# Patient Record
Sex: Female | Born: 1985 | Hispanic: Yes | Smoking: Never smoker
Health system: Southern US, Community
[De-identification: ages and names within clinical notes are randomized; demographics above are authoritative.]

## PROBLEM LIST (undated history)

## (undated) DIAGNOSIS — O139 Gestational [pregnancy-induced] hypertension without significant proteinuria, unspecified trimester: Secondary | ICD-10-CM

## (undated) HISTORY — PX: NO PAST SURGERIES: SHX2092

---

## 2006-10-10 ENCOUNTER — Ambulatory Visit (HOSPITAL_COMMUNITY): Admission: RE | Admit: 2006-10-10 | Discharge: 2006-10-10 | Payer: Self-pay | Admitting: Obstetrics and Gynecology

## 2007-03-03 ENCOUNTER — Ambulatory Visit: Payer: Self-pay | Admitting: Family Medicine

## 2007-03-03 ENCOUNTER — Inpatient Hospital Stay (HOSPITAL_COMMUNITY): Admission: AD | Admit: 2007-03-03 | Discharge: 2007-03-03 | Payer: Self-pay | Admitting: Family Medicine

## 2007-03-04 ENCOUNTER — Ambulatory Visit: Payer: Self-pay | Admitting: Obstetrics & Gynecology

## 2007-03-04 ENCOUNTER — Inpatient Hospital Stay (HOSPITAL_COMMUNITY): Admission: AD | Admit: 2007-03-04 | Discharge: 2007-03-06 | Payer: Self-pay | Admitting: Obstetrics and Gynecology

## 2008-01-20 ENCOUNTER — Ambulatory Visit (HOSPITAL_COMMUNITY): Admission: RE | Admit: 2008-01-20 | Discharge: 2008-01-20 | Payer: Self-pay | Admitting: Family Medicine

## 2008-03-18 ENCOUNTER — Ambulatory Visit (HOSPITAL_COMMUNITY): Admission: RE | Admit: 2008-03-18 | Discharge: 2008-03-18 | Payer: Self-pay | Admitting: Obstetrics & Gynecology

## 2008-06-11 ENCOUNTER — Ambulatory Visit: Payer: Self-pay | Admitting: Physician Assistant

## 2008-06-11 ENCOUNTER — Inpatient Hospital Stay (HOSPITAL_COMMUNITY): Admission: AD | Admit: 2008-06-11 | Discharge: 2008-06-13 | Payer: Self-pay | Admitting: Obstetrics & Gynecology

## 2011-04-18 LAB — CBC
HCT: 40.3
Hemoglobin: 14
MCV: 92.3
Platelets: 229
RBC: 4.37
WBC: 17.9 — ABNORMAL HIGH

## 2011-04-18 LAB — RPR: RPR Ser Ql: NONREACTIVE

## 2011-04-28 LAB — URIC ACID: Uric Acid, Serum: 5.9

## 2011-04-28 LAB — CBC
HCT: 34.6 — ABNORMAL LOW
HCT: 41.9
Hemoglobin: 12.3
Hemoglobin: 14.7
MCHC: 35.3
MCHC: 35.6
MCV: 89.5
MCV: 90.1
RBC: 3.86 — ABNORMAL LOW
RBC: 4.54
RDW: 12.5
RDW: 12.6
RDW: 12.9

## 2011-04-28 LAB — URINALYSIS, ROUTINE W REFLEX MICROSCOPIC
Bilirubin Urine: NEGATIVE
Ketones, ur: 15 — AB
Nitrite: NEGATIVE
Specific Gravity, Urine: 1.015
Urobilinogen, UA: 0.2

## 2011-04-28 LAB — URINE MICROSCOPIC-ADD ON

## 2011-04-28 LAB — COMPREHENSIVE METABOLIC PANEL
ALT: 15
Alkaline Phosphatase: 176 — ABNORMAL HIGH
BUN: 6
CO2: 20
Chloride: 105
Glucose, Bld: 110 — ABNORMAL HIGH
Potassium: 3.1 — ABNORMAL LOW
Sodium: 133 — ABNORMAL LOW
Total Bilirubin: 0.9

## 2011-04-28 LAB — LACTATE DEHYDROGENASE: LDH: 205

## 2015-07-18 NOTE — L&D Delivery Note (Signed)
Delivery Note At 4:36 PM a viable and healthy female was delivered via  (Presentation: OA; ROT), nuccal x 1 reduced a perineum easily.  Cord clamping delayed by 60 seconds after delivery.  APGAR: 8,9 ; weight  pending.  Placenta status: intact , spontaneous, manual sweep required due to delay in removing membranes completely .  Cord:  with the following complications: .  Cord pH: not sent  Anesthesia: Epidural  Episiotomy:  None Lacerations:  None Suture Repair: None Est. Blood Loss (mL):  100  Mom to postpartum.  Baby to Couplet care / Skin to Skin.  Olena LeatherwoodKelly M Merrit Friesen 01/11/2016, 4:58 PM

## 2015-07-29 LAB — OB RESULTS CONSOLE ABO/RH: RH Type: POSITIVE

## 2015-07-29 LAB — OB RESULTS CONSOLE RPR: RPR: NONREACTIVE

## 2015-07-29 LAB — OB RESULTS CONSOLE GC/CHLAMYDIA
CHLAMYDIA, DNA PROBE: NEGATIVE
GC PROBE AMP, GENITAL: NEGATIVE

## 2015-07-29 LAB — OB RESULTS CONSOLE RUBELLA ANTIBODY, IGM: RUBELLA: IMMUNE

## 2015-07-29 LAB — OB RESULTS CONSOLE ANTIBODY SCREEN: Antibody Screen: NEGATIVE

## 2015-07-29 LAB — OB RESULTS CONSOLE HIV ANTIBODY (ROUTINE TESTING): HIV: NONREACTIVE

## 2015-12-16 LAB — OB RESULTS CONSOLE GBS
STREP GROUP B AG: POSITIVE
STREP GROUP B AG: POSITIVE

## 2016-01-10 ENCOUNTER — Inpatient Hospital Stay (HOSPITAL_COMMUNITY)
Admission: AD | Admit: 2016-01-10 | Discharge: 2016-01-12 | DRG: 775 | Disposition: A | Discharge status: Home / Self Care | Payer: Medicaid Other | Source: Ambulatory Visit | Attending: Family Medicine | Admitting: Family Medicine

## 2016-01-10 ENCOUNTER — Encounter (HOSPITAL_COMMUNITY): Payer: Self-pay | Admitting: *Deleted

## 2016-01-10 DIAGNOSIS — O1400 Mild to moderate pre-eclampsia, unspecified trimester: Secondary | ICD-10-CM | POA: Diagnosis present

## 2016-01-10 DIAGNOSIS — O134 Gestational [pregnancy-induced] hypertension without significant proteinuria, complicating childbirth: Secondary | ICD-10-CM | POA: Diagnosis present

## 2016-01-10 DIAGNOSIS — O99824 Streptococcus B carrier state complicating childbirth: Secondary | ICD-10-CM | POA: Diagnosis present

## 2016-01-10 DIAGNOSIS — O1494 Unspecified pre-eclampsia, complicating childbirth: Secondary | ICD-10-CM | POA: Diagnosis present

## 2016-01-10 DIAGNOSIS — Z3A40 40 weeks gestation of pregnancy: Secondary | ICD-10-CM

## 2016-01-10 DIAGNOSIS — Z79899 Other long term (current) drug therapy: Secondary | ICD-10-CM | POA: Diagnosis not present

## 2016-01-10 DIAGNOSIS — O133 Gestational [pregnancy-induced] hypertension without significant proteinuria, third trimester: Secondary | ICD-10-CM

## 2016-01-10 HISTORY — DX: Gestational (pregnancy-induced) hypertension without significant proteinuria, unspecified trimester: O13.9

## 2016-01-10 LAB — URINALYSIS, ROUTINE W REFLEX MICROSCOPIC
Bilirubin Urine: NEGATIVE
GLUCOSE, UA: NEGATIVE mg/dL
Hgb urine dipstick: NEGATIVE
KETONES UR: NEGATIVE mg/dL
NITRITE: NEGATIVE
PROTEIN: NEGATIVE mg/dL
Specific Gravity, Urine: <1.005 — ABNORMAL LOW (ref 1.005–1.030)
pH: 6 (ref 5.0–8.0)

## 2016-01-10 LAB — CBC WITH DIFFERENTIAL/PLATELET
Basophils Absolute: 0 10*3/uL (ref 0.0–0.1)
Basophils Relative: 0 %
EOS PCT: 2 %
Eosinophils Absolute: 0.1 10*3/uL (ref 0.0–0.7)
HCT: 37.4 % (ref 36.0–46.0)
HEMOGLOBIN: 13.2 g/dL (ref 12.0–15.0)
LYMPHS ABS: 2.1 10*3/uL (ref 0.7–4.0)
LYMPHS PCT: 29 %
MCH: 30 pg (ref 26.0–34.0)
MCHC: 35.3 g/dL (ref 30.0–36.0)
MCV: 85 fL (ref 78.0–100.0)
MONOS PCT: 4 %
Monocytes Absolute: 0.3 10*3/uL (ref 0.1–1.0)
NEUTROS PCT: 65 %
Neutro Abs: 4.6 10*3/uL (ref 1.7–7.7)
Platelets: 251 10*3/uL (ref 150–400)
RBC: 4.4 MIL/uL (ref 3.87–5.11)
RDW: 13.2 % (ref 11.5–15.5)
WBC: 7.2 10*3/uL (ref 4.0–10.5)

## 2016-01-10 LAB — COMPREHENSIVE METABOLIC PANEL
ALT: 13 U/L — AB (ref 14–54)
AST: 21 U/L (ref 15–41)
Albumin: 2.9 g/dL — ABNORMAL LOW (ref 3.5–5.0)
Alkaline Phosphatase: 185 U/L — ABNORMAL HIGH (ref 38–126)
Anion gap: 11 (ref 5–15)
BUN: 8 mg/dL (ref 6–20)
CHLORIDE: 103 mmol/L (ref 101–111)
CO2: 20 mmol/L — AB (ref 22–32)
CREATININE: 0.51 mg/dL (ref 0.44–1.00)
Calcium: 8.9 mg/dL (ref 8.9–10.3)
GFR calc Af Amer: >60 mL/min (ref >60)
Glucose, Bld: 92 mg/dL (ref 65–99)
POTASSIUM: 3.5 mmol/L (ref 3.5–5.1)
SODIUM: 134 mmol/L — AB (ref 135–145)
Total Bilirubin: 0.4 mg/dL (ref 0.3–1.2)
Total Protein: 7.5 g/dL (ref 6.5–8.1)

## 2016-01-10 LAB — PROTEIN / CREATININE RATIO, URINE
Creatinine, Urine: 38 mg/dL
Protein Creatinine Ratio: 0.37 mg/mg{Cre} — ABNORMAL HIGH (ref 0.00–0.15)
Total Protein, Urine: 14 mg/dL

## 2016-01-10 LAB — TYPE AND SCREEN
ABO/RH(D): O POS
Antibody Screen: NEGATIVE

## 2016-01-10 LAB — URINE MICROSCOPIC-ADD ON: RBC / HPF: NONE SEEN RBC/hpf (ref 0–5)

## 2016-01-10 LAB — ABO/RH: ABO/RH(D): O POS

## 2016-01-10 MED ORDER — PENICILLIN G POTASSIUM 5000000 UNITS IJ SOLR
2.5000 10*6.[IU] | INTRAVENOUS | Status: DC
  Administered 2016-01-10 – 2016-01-11 (×6): 2.5 10*6.[IU] via INTRAVENOUS
  Filled 2016-01-10 (×9): qty 2.5

## 2016-01-10 MED ORDER — SOD CITRATE-CITRIC ACID 500-334 MG/5ML PO SOLN: 30.0000 mL | ORAL | Status: DC | PRN

## 2016-01-10 MED ORDER — OXYTOCIN BOLUS FROM INFUSION: 500.0000 mL | INTRAVENOUS | Status: DC

## 2016-01-10 MED ORDER — FLEET ENEMA 7-19 GM/118ML RE ENEM: 1.0000 | ENEMA | RECTAL | Status: DC | PRN

## 2016-01-10 MED ORDER — PENICILLIN G POTASSIUM 5000000 UNITS IJ SOLR
5.0000 10*6.[IU] | Freq: Once | INTRAVENOUS | Status: AC
  Administered 2016-01-10: 5 10*6.[IU] via INTRAVENOUS
  Filled 2016-01-10: qty 5

## 2016-01-10 MED ORDER — LACTATED RINGERS IV SOLN
INTRAVENOUS | Status: DC
  Administered 2016-01-10 (×2): via INTRAVENOUS

## 2016-01-10 MED ORDER — OXYCODONE-ACETAMINOPHEN 5-325 MG PO TABS: 2.0000 | ORAL_TABLET | ORAL | Status: DC | PRN

## 2016-01-10 MED ORDER — ACETAMINOPHEN 325 MG PO TABS: 650.0000 mg | ORAL_TABLET | ORAL | Status: DC | PRN

## 2016-01-10 MED ORDER — OXYTOCIN 40 UNITS IN LACTATED RINGERS INFUSION - SIMPLE MED
1.0000 m[IU]/min | INTRAVENOUS | Status: DC
  Administered 2016-01-10: 2 m[IU]/min via INTRAVENOUS

## 2016-01-10 MED ORDER — TERBUTALINE SULFATE 1 MG/ML IJ SOLN
0.2500 mg | Freq: Once | INTRAMUSCULAR | Status: DC | PRN
  Filled 2016-01-10: qty 1

## 2016-01-10 MED ORDER — LIDOCAINE HCL (PF) 1 % IJ SOLN
30.0000 mL | INTRAMUSCULAR | Status: DC | PRN
  Filled 2016-01-10 (×2): qty 30

## 2016-01-10 MED ORDER — LACTATED RINGERS IV SOLN: 500.0000 mL | INTRAVENOUS | Status: DC | PRN

## 2016-01-10 MED ORDER — ONDANSETRON HCL 4 MG/2ML IJ SOLN: 4.0000 mg | Freq: Four times a day (QID) | INTRAMUSCULAR | Status: DC | PRN

## 2016-01-10 MED ORDER — OXYTOCIN 40 UNITS IN LACTATED RINGERS INFUSION - SIMPLE MED
2.5000 [IU]/h | INTRAVENOUS | Status: DC
  Administered 2016-01-11: 2.5 [IU]/h via INTRAVENOUS
  Filled 2016-01-10: qty 1000

## 2016-01-10 MED ORDER — FENTANYL CITRATE (PF) 100 MCG/2ML IJ SOLN
100.0000 ug | INTRAMUSCULAR | Status: DC | PRN
  Administered 2016-01-11: 100 ug via INTRAVENOUS
  Filled 2016-01-10: qty 2

## 2016-01-10 MED ORDER — OXYCODONE-ACETAMINOPHEN 5-325 MG PO TABS: 1.0000 | ORAL_TABLET | ORAL | Status: DC | PRN

## 2016-01-10 NOTE — Progress Notes (Signed)
LABOR PROGRESS NOTE  Alyssa GoingDora Patton is a 30 y.o. G3P2002 at 7583w0d  admitted for IOL for SIPE+gHTN  Subjective: Pt remains comfortable, minimal discomfort with contractions  Objective: BP 135/86 mmHg  Pulse 82  Temp(Src) 98.7 F (37.1 C) (Oral)  Resp 16  Ht 5\' 3"  (1.6 m)  Wt 156 lb (70.761 kg)  BMI 27.64 kg/m2 or  Filed Vitals:   01/10/16 1731 01/10/16 1802 01/10/16 1834 01/10/16 1859  BP: 132/85 126/82 124/83 135/86  Pulse: 86 81 87 82  Temp:   98.7 F (37.1 C)   TempSrc:   Oral   Resp: 18  16   Height:      Weight:         Dilation: 2.5 Effacement (%): 70 Station: -2 Presentation: Vertex Exam by:: Alyssa Stalling RN  Labs: Lab Results  Component Value Date   WBC 7.2 01/10/2016   HGB 13.2 01/10/2016   HCT 37.4 01/10/2016   MCV 85.0 01/10/2016   PLT 251 01/10/2016    Patient Active Problem List   Diagnosis Date Noted  . Gestational HTN 01/10/2016    Assessment / Plan: 30 y.o. G3P2002 at 4583w0d here for IOL for SIPE+gHTN  Labor: Latent, need to increase pitocin and if FHT becomes non-reassuring can decrease Fetal Wellbeing:  Cat-1 Pain Control:  Fentanyl, NO Anticipated MOD:  NSVD  Olena LeatherwoodKelly M Lezley Bedgood, MD 01/10/2016, 7:31 PM

## 2016-01-10 NOTE — H&P (Signed)
LABOR AND DELIVERY ADMISSION HISTORY AND PHYSICAL NOTE  Alyssa GoingDora Patton is a 30 y.o. female 783P2002 with IUP at 3235w0d by LMP presenting for IOL for gHTN.  Prenatal risk factors: multipara, GBS positive, gHTN She reports positive fetal movement. She denies leakage of fluid or vaginal bleeding.  Prenatal History/Complications:  Past Medical History: Past Medical History  Diagnosis Date  . Pregnancy induced hypertension     Past Surgical History: Past Surgical History  Procedure Laterality Date  . No past surgeries      Obstetrical History: OB History    Gravida Para Term Preterm AB TAB SAB Ectopic Multiple Living   3 2 2       2       Social History: Social History   Social History  . Marital Status: Married    Spouse Name: N/A  . Number of Children: N/A  . Years of Education: N/A   Social History Main Topics  . Smoking status: Never Smoker   . Smokeless tobacco: None  . Alcohol Use: No  . Drug Use: No  . Sexual Activity: Yes   Other Topics Concern  . None   Social History Narrative  . None    Family History: History reviewed. No pertinent family history.  Allergies: No Known Allergies  Prescriptions prior to admission  Medication Sig Dispense Refill Last Dose  . Prenatal Vit-Fe Fumarate-FA (PRENATAL MULTIVITAMIN) TABS tablet Take 1 tablet by mouth daily at 12 noon.   01/09/2016 at Unknown time     Review of Systems   All systems reviewed and negative except as stated in HPI  Blood pressure 142/92, pulse 85, temperature 97.9 F (36.6 C), temperature source Oral, resp. rate 16. General appearance: alert and cooperative Lungs: clear to auscultation bilaterally Heart: regular rate and rhythm Abdomen: soft, non-tender; bowel sounds normal Extremities: No calf swelling or tenderness Presentation: cephalic Fetal monitoring: Baseline 135, mod variability, +acels, no decels Uterine activity: None     Prenatal labs: ABO, Rh:  O positive Antibody:   Negative Rubella: Immune RPR:   Negative HBsAg:   Negative HIV:   Non-reactive GBS: Positive (06/01 0000)  1 hr Glucola:102  Genetic screening:  Negative Anatomy US: normal  Prenatal Transfer Tool  Maternal Diabetes: No Genetic Screening: Normal Maternal Ultrasounds/Referrals: Normal Fetal Ultrasounds or other Referrals:  None Maternal Substance Abuse:  No Significant Maternal Medications:  None Significant Maternal Lab Results: None  Results for orders placed or performed during the hospital encounter of 01/10/16 (from the past 24 hour(s))  Urinalysis, Routine w reflex microscopic (not at [facility])   Collection Time: 01/10/16 11:22 AM  Result Value Ref Range   Color, Urine YELLOW YELLOW   APPearance CLEAR CLEAR   Specific Gravity, Urine <1.005 (L) 1.005 - 1.030   pH 6.0 5.0 - 8.0   Glucose, UA NEGATIVE NEGATIVE mg/dL   Hgb urine dipstick NEGATIVE NEGATIVE   Bilirubin Urine NEGATIVE NEGATIVE   Ketones, ur NEGATIVE NEGATIVE mg/dL   Protein, ur NEGATIVE NEGATIVE mg/dL   Nitrite NEGATIVE NEGATIVE   Leukocytes, UA SMALL (A) NEGATIVE  Protein / creatinine ratio, urine   Collection Time: 01/10/16 11:22 AM  Result Value Ref Range   Creatinine, Urine 38.00 mg/dL   Total Protein, Urine 14 mg/dL   Protein Creatinine Ratio 0.37 (H) 0.00 - 0.15 mg/mg[Cre]  Urine microscopic-add on   Collection Time: 01/10/16 11:22 AM  Result Value Ref Range   Squamous Epithelial / LPF 0-5 (A) NONE SEEN   WBC, UA  0-5 0 - 5 WBC/hpf   RBC / HPF NONE SEEN 0 - 5 RBC/hpf   Bacteria, UA FEW (A) NONE SEEN  CBC with Differential/Platelet   Collection Time: 01/10/16 12:13 PM  Result Value Ref Range   WBC 7.2 4.0 - 10.5 K/uL   RBC 4.40 3.87 - 5.11 MIL/uL   Hemoglobin 13.2 12.0 - 15.0 g/dL   HCT 16.137.4 09.636.0 - 04.546.0 %   MCV 85.0 78.0 - 100.0 fL   MCH 30.0 26.0 - 34.0 pg   MCHC 35.3 30.0 - 36.0 g/dL   RDW 40.913.2 81.111.5 - 91.415.5 %   Platelets 251 150 - 400 K/uL   Neutrophils Relative % 65 %   Neutro Abs 4.6 1.7 -  7.7 K/uL   Lymphocytes Relative 29 %   Lymphs Abs 2.1 0.7 - 4.0 K/uL   Monocytes Relative 4 %   Monocytes Absolute 0.3 0.1 - 1.0 K/uL   Eosinophils Relative 2 %   Eosinophils Absolute 0.1 0.0 - 0.7 K/uL   Basophils Relative 0 %   Basophils Absolute 0.0 0.0 - 0.1 K/uL    There are no active problems to display for this patient.   Assessment: Alyssa GoingDora Patton is a 30 y.o. G3P2002 at 3164w0d here for IOL for gHTN  #Labor:Latent, will start pitocin #Pain: Epidural now #FWB: Cat-1 #ID:  GBS positive-PCN #MOF: Breast #MOC:IUD #Circ:  Female, not necessary  Olena LeatherwoodKelly M Aguilar 01/10/2016, 12:42 PM   Midwife attestation: I have seen and examined this patient; I agree with above documentation in the resident's note.   Alyssa GoingDora Patton is a 30 y.o. [id number] here for HTN and decreased FM  PE: BP 144/109 mmHg  Pulse 120  Temp(Src) 97.9 F (36.6 C) (Oral)  Resp 16 Gen: calm comfortable, NAD Resp: normal effort, no distress Abd: gravid  ROS, labs, PMH reviewed  Plan: Admit to LD Labor: latent-plan Pitocin FWB: Cat I ID: GBS pos-PCN protocol  Donette LarryMelanie Hammad Finkler, CNM  01/10/2016, 1:29 PM

## 2016-01-10 NOTE — Anesthesia Pain Management Evaluation Note (Signed)
  [facility] Pain Management Visit Note  Patient: Alyssa Patton, 30 y.o., female  "Hello I am a [facility] of the [facility]. We have an anesthesia team available at all times to provide care throughout the hospital, including epidural management and anesthesia for C-section. I don't know your plan for the delivery whether it a natural birth, water birth, IV sedation, nitrous supplementation, doula or epidural, but we want to meet your pain goals."   1.Was your pain managed to your expectations on prior hospitalizations?     2.What is your expectation for pain management during this hospitalization?     3.How can we help you reach that goal?   Record the patient's initial score and the patient's pain goal.   Pain: 4  Pain Goal: 6 [facility] wants you to be able to say your pain was always managed very well.  Alyssa Patton,Alyssa Patton 01/10/2016

## 2016-01-10 NOTE — Progress Notes (Signed)
LABOR PROGRESS NOTE  Alyssa GoingDora Patton is a 30 y.o. [id number] at 2973w0d  admitted for IOL for SIPE+gHTN.  Subjective: Pt lying comfortable, reports contractions on par with menstrual cramps and mild.    Objective: BP 124/85 mmHg  Pulse 85  Temp(Src) 98.4 F (36.9 C) (Oral)  Resp 18  Ht 5\' 3"  (1.6 m)  Wt 156 lb (70.761 kg)  BMI 27.64 kg/m2 or  Filed Vitals:   01/10/16 1501 01/10/16 1534 01/10/16 1601 01/10/16 1633  BP: 135/92 129/86 129/89 124/85  Pulse: 78 84 85 85  Temp:      TempSrc:      Resp: 16  18   Height:      Weight:         Dilation: 2 Effacement (%): 70 Station: -2 Presentation: Vertex Exam by:: foley,rn  Labs: Lab Results  Component Value Date   WBC 7.2 01/10/2016   HGB 13.2 01/10/2016   HCT 37.4 01/10/2016   MCV 85.0 01/10/2016   PLT 251 01/10/2016    Patient Active Problem List   Diagnosis Date Noted  . Gestational HTN 01/10/2016    Assessment / Plan: 30 y.o. G3P2002 at 6073w0d here for IOL for SIPE w/o severe features + gHTN  Labor: Latent, on pitocin Fetal Wellbeing:  Cat-1 Pain Control:  Fentanyl, NO, epidural whenever pt desires Anticipated MOD:  NSVD  Olena LeatherwoodKelly M Sharvil Hoey, MD 01/10/2016, 4:49 PM

## 2016-01-10 NOTE — Progress Notes (Signed)
[facility]  Pt to be admitted to room 174.

## 2016-01-10 NOTE — Progress Notes (Signed)
Labor Progress Note Wallace GoingDora Cruz-Ruiz is a 30 y.o. G3P2002 at 3553w0d presented for IOL for pre-E S: lying in bed. Reports mild contractions. Denies headache, vision changes, RUQ or shortness of breath.  O:  BP 126/80 mmHg  Pulse 83  Temp(Src) 98.7 F (37.1 C) (Oral)  Resp 18  Ht 5\' 3"  (1.6 m)  Wt 156 lb (70.761 kg)  BMI 27.64 kg/m2  GEN: lying in bed comfortably although toco shows contraction every two minutes.  EFM: 150/mod var/+accels/no decels  CVE: Dilation: 2.5 Effacement (%): 70 Station: -2 Presentation: Vertex Exam by:: [place]Brittany Stalling RN   A&P: 30 y.o. [id number] 2553w0d admitted for pre-E.  #Pre-E: BP within normal range for most of this afternoon. No severe features. Will continue to monitor #Labor: on pit. Increased to 9u/hr. Contraction every 2 minutes on toco but patient not feeling.  #Pain: Epidural when in established labor #FWB: CAT-1 #GBS pos: PCN  Almon Herculesaye T Gonfa, MD 8:53 PM

## 2016-01-10 NOTE — MAU Note (Signed)
[facility], had reactive NST & BPP 8/8 there.  Pt denies HA, no uc's, denies bleeding or LOF.

## 2016-01-11 ENCOUNTER — Encounter (HOSPITAL_COMMUNITY): Payer: Self-pay | Admitting: Anesthesiology

## 2016-01-11 ENCOUNTER — Inpatient Hospital Stay (HOSPITAL_COMMUNITY): Payer: Medicaid Other | Admitting: Anesthesiology

## 2016-01-11 DIAGNOSIS — O99824 Streptococcus B carrier state complicating childbirth: Secondary | ICD-10-CM

## 2016-01-11 DIAGNOSIS — Z3A40 40 weeks gestation of pregnancy: Secondary | ICD-10-CM

## 2016-01-11 DIAGNOSIS — O134 Gestational [pregnancy-induced] hypertension without significant proteinuria, complicating childbirth: Secondary | ICD-10-CM

## 2016-01-11 LAB — CBC
HCT: 35.8 % — ABNORMAL LOW (ref 36.0–46.0)
Hemoglobin: 12.2 g/dL (ref 12.0–15.0)
MCH: 29 pg (ref 26.0–34.0)
MCHC: 34.1 g/dL (ref 30.0–36.0)
MCV: 85 fL (ref 78.0–100.0)
Platelets: 219 10*3/uL (ref 150–400)
RBC: 4.21 MIL/uL (ref 3.87–5.11)
RDW: 13.4 % (ref 11.5–15.5)
WBC: 8 10*3/uL (ref 4.0–10.5)

## 2016-01-11 LAB — RPR: RPR: NONREACTIVE

## 2016-01-11 MED ORDER — FENTANYL 2.5 MCG/ML BUPIVACAINE 1/10 % EPIDURAL INFUSION (WH - ANES)
14.0000 mL/h | INTRAMUSCULAR | Status: DC | PRN
  Administered 2016-01-11 (×2): 14 mL/h via EPIDURAL
  Filled 2016-01-11: qty 125

## 2016-01-11 MED ORDER — ONDANSETRON HCL 4 MG/2ML IJ SOLN: 4.0000 mg | INTRAMUSCULAR | Status: DC | PRN

## 2016-01-11 MED ORDER — DIBUCAINE 1 % RE OINT: 1.0000 "application " | TOPICAL_OINTMENT | RECTAL | Status: DC | PRN

## 2016-01-11 MED ORDER — PRENATAL MULTIVITAMIN CH
1.0000 | ORAL_TABLET | Freq: Every day | ORAL | Status: DC
  Administered 2016-01-12: 1 via ORAL
  Filled 2016-01-11: qty 1

## 2016-01-11 MED ORDER — LACTATED RINGERS IV SOLN
500.0000 mL | Freq: Once | INTRAVENOUS | Status: AC
  Administered 2016-01-11: 500 mL via INTRAVENOUS

## 2016-01-11 MED ORDER — LIDOCAINE HCL (PF) 1 % IJ SOLN
INTRAMUSCULAR | Status: DC | PRN
  Administered 2016-01-11: 6 mL via EPIDURAL
  Administered 2016-01-11: 5 mL via EPIDURAL

## 2016-01-11 MED ORDER — TETANUS-DIPHTH-ACELL PERTUSSIS 5-2.5-18.5 LF-MCG/0.5 IM SUSP: 0.5000 mL | Freq: Once | INTRAMUSCULAR | Status: DC

## 2016-01-11 MED ORDER — SENNOSIDES-DOCUSATE SODIUM 8.6-50 MG PO TABS
2.0000 | ORAL_TABLET | ORAL | Status: DC
  Administered 2016-01-11: 2 via ORAL
  Filled 2016-01-11: qty 2

## 2016-01-11 MED ORDER — ONDANSETRON HCL 4 MG PO TABS
4.0000 mg | ORAL_TABLET | ORAL | Status: DC | PRN
  Administered 2016-01-11: 4 mg via ORAL
  Filled 2016-01-11: qty 1

## 2016-01-11 MED ORDER — PHENYLEPHRINE 40 MCG/ML (10ML) SYRINGE FOR IV PUSH (FOR BLOOD PRESSURE SUPPORT)
80.0000 ug | PREFILLED_SYRINGE | INTRAVENOUS | Status: DC | PRN
  Filled 2016-01-11: qty 5
  Filled 2016-01-11: qty 10

## 2016-01-11 MED ORDER — ACETAMINOPHEN 325 MG PO TABS
650.0000 mg | ORAL_TABLET | ORAL | Status: DC | PRN
  Administered 2016-01-12: 650 mg via ORAL
  Filled 2016-01-11: qty 2

## 2016-01-11 MED ORDER — DIPHENHYDRAMINE HCL 25 MG PO CAPS: 25.0000 mg | ORAL_CAPSULE | Freq: Four times a day (QID) | ORAL | Status: DC | PRN

## 2016-01-11 MED ORDER — SIMETHICONE 80 MG PO CHEW: 80.0000 mg | CHEWABLE_TABLET | ORAL | Status: DC | PRN

## 2016-01-11 MED ORDER — ZOLPIDEM TARTRATE 5 MG PO TABS: 5.0000 mg | ORAL_TABLET | Freq: Every evening | ORAL | Status: DC | PRN

## 2016-01-11 MED ORDER — IBUPROFEN 600 MG PO TABS
600.0000 mg | ORAL_TABLET | Freq: Four times a day (QID) | ORAL | Status: DC
  Administered 2016-01-11 – 2016-01-12 (×4): 600 mg via ORAL
  Filled 2016-01-11 (×4): qty 1

## 2016-01-11 MED ORDER — EPHEDRINE 5 MG/ML INJ
10.0000 mg | INTRAVENOUS | Status: DC | PRN
  Filled 2016-01-11: qty 2

## 2016-01-11 MED ORDER — WITCH HAZEL-GLYCERIN EX PADS: 1.0000 "application " | MEDICATED_PAD | CUTANEOUS | Status: DC | PRN

## 2016-01-11 MED ORDER — BENZOCAINE-MENTHOL 20-0.5 % EX AERO: 1.0000 "application " | INHALATION_SPRAY | CUTANEOUS | Status: DC | PRN

## 2016-01-11 MED ORDER — PHENYLEPHRINE 40 MCG/ML (10ML) SYRINGE FOR IV PUSH (FOR BLOOD PRESSURE SUPPORT)
80.0000 ug | PREFILLED_SYRINGE | INTRAVENOUS | Status: DC | PRN
  Filled 2016-01-11: qty 5

## 2016-01-11 MED ORDER — COCONUT OIL OIL: 1.0000 "application " | TOPICAL_OIL | Status: DC | PRN

## 2016-01-11 MED ORDER — DIPHENHYDRAMINE HCL 50 MG/ML IJ SOLN: 12.5000 mg | INTRAMUSCULAR | Status: DC | PRN

## 2016-01-11 NOTE — Anesthesia Procedure Notes (Signed)
Epidural Patient location during procedure: OB Start time: 01/11/2016 9:42 AM End time: 01/11/2016 9:45 AM  Staffing Anesthesiologist: Leilani AbleHATCHETT, Chanita Boden Performed by: anesthesiologist   Preanesthetic Checklist Completed: patient identified, surgical consent, pre-op evaluation, timeout performed, IV checked, risks and benefits discussed and monitors and equipment checked  Epidural Patient position: sitting Prep: site prepped and draped and DuraPrep Patient monitoring: continuous pulse ox and blood pressure Approach: midline Location: L3-L4 Injection technique: LOR air  Needle:  Needle type: Tuohy  Needle gauge: 17 G Needle length: 9 cm and 9 Needle insertion depth: 4 cm Catheter type: closed end flexible Catheter size: 19 Gauge Catheter at skin depth: 9 cm Test dose: negative and Other  Assessment Sensory level: T9 Events: blood not aspirated, injection not painful, no injection resistance, negative IV test and no paresthesia  Additional Notes Reason for block:procedure for pain

## 2016-01-11 NOTE — Lactation Note (Signed)
This note was copied from a baby's chart. Lactation Consultation Note  Patient Name: Alyssa Patton [id number]'XToday's Date: 01/11/2016 Reason for consult: Initial assessment Interpreter used. Baby at 4 hr of life. Experienced bf mom reports latching is going well, denies breast or nipple pain, voiced no concerns. She wants to bf and formula feed. Mom was really sleepy, may need more education. Discussed baby behavior, feeding frequency, supplementing, baby belly size, voids, wt loss, breast changes, and nipple care. She stated she can manually express and has spoon in room. Given lactation handouts. Aware of OP services and support group. She will call as needed.     Maternal Data Has patient been taught Hand Expression?: Yes Does the patient have breastfeeding experience prior to this delivery?: Yes  Feeding Feeding Type: Breast Fed Length of feed: 15 min  LATCH Score/Interventions Latch: Grasps breast easily, tongue down, lips flanged, rhythmical sucking.  Audible Swallowing: None Intervention(s): Skin to skin  Type of Nipple: Everted at rest and after stimulation  Comfort (Breast/Nipple): Soft / non-tender     Hold (Positioning): No assistance needed to correctly position infant at breast.  LATCH Score: 8  Lactation Tools Discussed/Used WIC Program: Yes   Consult Status Consult Status: Follow-up Date: 01/12/16 Follow-up type: In-patient    Alyssa Patton 01/11/2016, 8:51 PM

## 2016-01-11 NOTE — Progress Notes (Signed)
I assisted RN with admission questions, also I assisted [facility]Elizabeth [facility], with some questions, by Orlan LeavensViria Alvarez Spanish Interpreter.

## 2016-01-11 NOTE — Progress Notes (Signed)
I was present during the delivery with Dr Cephus RicherAguilar, by Orlan LeavensViria Alvarez Spanish Interpreter.

## 2016-01-11 NOTE — Progress Notes (Signed)
Labor Progress Note Wallace GoingDora Cruz-Ruiz is a 30 y.o. G3P2002 at 5761w0d presented for IOL for pre-E S: lying in bed. Reports contractions. Denies headache, vision changes, RUQ or shortness of breath.  O:  BP 122/67 mmHg  Pulse 93  Temp(Src) 98.4 F (36.9 C) (Oral)  Resp 16  Ht 5\' 3"  (1.6 m)  Wt 156 lb (70.761 kg)  BMI 27.64 kg/m2  GEN: lying in bed comfortably although toco shows contraction every two minutes.  EFM: 140/mod var/+accels/no decels  CVE: Dilation: 2 Effacement (%): 70 Station: -2 Presentation: Vertex Exam by:: Dr. Alanda SlimGonfa   A&P: 30 y.o. [id number] 7861w0d admitted for pre-E.  #Pre-E: one mild range BP this morning. No severe features. Will continue to monitor #Labor: continue pitocin #Pain: as needed pain for now. Epidural when in established labor #FWB: CAT-1 #GBS pos: PCN  Almon Herculesaye T Denicia Pagliarulo, MD 6:12 AM

## 2016-01-11 NOTE — Progress Notes (Signed)
LABOR PROGRESS NOTE  Alyssa GoingDora Patton is a 30 y.o. G3P2002 at 7332w1d  admitted for IOL for pre-eclampsia without severe features  Subjective: Pt remains comfortable, epidural  Objective: BP 122/82 mmHg  Pulse 78  Temp(Src) 98.6 F (37 C) (Oral)  Resp 18  Ht 5\' 3"  (1.6 m)  Wt 156 lb (70.761 kg)  BMI 27.64 kg/m2  SpO2 98% or  Filed Vitals:   01/11/16 1006 01/11/16 1010 01/11/16 1011 01/11/16 1015  BP: 125/78  123/81 122/82  Pulse: 71  72 78  Temp:      TempSrc:      Resp: 20  20 18   Height:      Weight:      SpO2:  98%      Dilation: 5 Effacement (%): 80 Station: 0 Presentation: Vertex Exam by:: Gifford ShaveYancey Luft RN   Labs: Lab Results  Component Value Date   WBC 8.0 01/11/2016   HGB 12.2 01/11/2016   HCT 35.8* 01/11/2016   MCV 85.0 01/11/2016   PLT 219 01/11/2016    Patient Active Problem List   Diagnosis Date Noted  . Gestational HTN 01/10/2016    Assessment / Plan: 30 y.o. G3P2002 at 7132w1d here for IOL for pre-eclampsia without severe features  Labor: Latent, Fetal Wellbeing:  Cat-II (variable, late decel x1 noted), pt was repositioned and bolus given by nursing Pain Control:  Epidural Anticipated MOD:  NSVD  Olena LeatherwoodKelly M Deadra Diggins, MD 01/11/2016, 10:23 AM

## 2016-01-11 NOTE — Anesthesia Preprocedure Evaluation (Signed)
Anesthesia Evaluation  Patient identified by MRN, date of birth, ID band Patient awake    Reviewed: Allergy & Precautions, H&P , Patient's Chart, lab work & pertinent test results  Airway Mallampati: I  TM Distance: >3 FB Neck ROM: full    Dental no notable dental hx.    Pulmonary neg pulmonary ROS,    Pulmonary exam normal        Cardiovascular hypertension, Normal cardiovascular exam     Neuro/Psych negative neurological ROS  negative psych ROS   GI/Hepatic negative GI ROS, Neg liver ROS,   Endo/Other  negative endocrine ROS  Renal/GU negative Renal ROS     Musculoskeletal   Abdominal Normal abdominal exam  (+)   Peds  Hematology negative hematology ROS (+)   Anesthesia Other Findings   Reproductive/Obstetrics (+) Pregnancy                             Anesthesia Physical Anesthesia Plan  ASA: II  Anesthesia Plan: Epidural   Post-op Pain Management:    Induction:   Airway Management Planned:   Additional Equipment:   Intra-op Plan:   Post-operative Plan:   Informed Consent: I have reviewed the patients History and Physical, chart, labs and discussed the procedure including the risks, benefits and alternatives for the proposed anesthesia with the patient or authorized representative who has indicated his/her understanding and acceptance.     Plan Discussed with:   Anesthesia Plan Comments:         Anesthesia Quick Evaluation

## 2016-01-12 NOTE — Progress Notes (Signed)
I assisted Jessica, RN with questions. Eda H Royal Interpreter. °

## 2016-01-12 NOTE — Anesthesia Postprocedure Evaluation (Signed)
Anesthesia Post Note  Patient: Alyssa Patton  Procedure(s) Performed: * No procedures listed *  Patient location during evaluation: Mother Baby Anesthesia Type: Epidural Level of consciousness: awake Pain management: pain level controlled Vital Signs Assessment: post-procedure vital signs reviewed and stable Respiratory status: spontaneous breathing Cardiovascular status: stable Postop Assessment: no headache, no backache, epidural receding and patient able to bend at knees (ambulating;interpreter used for questioning) Anesthetic complications: no     Last Vitals:  Filed Vitals:   01/11/16 2330 01/12/16 0542  BP: 113/75 111/66  Pulse: 79 83  Temp: 37 C 36.7 C  Resp: 18 18    Last Pain:  Filed Vitals:   01/12/16 0824  PainSc: 0-No pain   Pain Goal: Patients Stated Pain Goal: 3 (01/12/16 0601)               Edison PaceWILKERSON,Govind Furey

## 2016-01-12 NOTE — Discharge Summary (Signed)
OB Discharge Summary     Patient Name: Alyssa Patton DOB: 09/05/1985 MRN: [id number]  Date of admission: 01/10/2016 Delivering MD: Shonna ChockWOUK, NOAH BEDFORD   Date of discharge: 01/14/2016  Admitting diagnosis: 40WKS, HIGH BP Intrauterine pregnancy: 3324w1d     Secondary diagnosis:  Principal Problem:   NSVD (normal spontaneous vaginal delivery) Active Problems:   Mild preeclampsia  Additional problems: Normal vaginal delivery     Discharge diagnosis: Term Pregnancy Delivered                                                                                                Post partum procedures:None  Augmentation: Pitocin and Foley Balloon  Complications: None  Hospital course:  Induction of Labor With Vaginal Delivery   30 y.o. yo G3P3001 at 5424w1d was admitted to the hospital 01/10/2016 for induction of labor.  Indication for induction: Preeclampsia.  Patient had an uncomplicated labor course as follows: Membrane Rupture Time/Date: 3:08 PM ,01/11/2016   Intrapartum Procedures: Episiotomy: None [1]                                         Lacerations:  None [1]  Patient had delivery of a Viable infant.  Information for the patient's newborn:  Cindie LarocheCruz Hernandez, Kaylee Alexa [[id number]][[id number]]  Delivery Method: Vag-Spont    01/11/2016  Details of delivery can be found in separate delivery note.  Patient had a routine postpartum course. Patient is discharged home 01/14/2016.   Physical exam  Filed Vitals:   01/11/16 1929 01/11/16 2330 01/12/16 0542 01/12/16 1815  BP: 132/78 113/75 111/66 137/85  Pulse: 71 79 83 67  Temp: 97.8 F (36.6 C) 98.6 F (37 C) 98.1 F (36.7 C) 98.8 F (37.1 C)  TempSrc: Oral Oral Oral Oral  Resp: 18 18 18 18   Height:      Weight:      SpO2:   98%    General: alert, cooperative and no distress Lochia: appropriate Uterine Fundus: firm Incision: N/A DVT Evaluation: No evidence of DVT seen on physical exam. Labs: Lab Results  Component Value Date   WBC 8.0  01/11/2016   HGB 12.2 01/11/2016   HCT 35.8* 01/11/2016   MCV 85.0 01/11/2016   PLT 219 01/11/2016   CMP Latest Ref Rng 01/10/2016  Glucose 65 - 99 mg/dL 92  BUN 6 - 20 mg/dL 8  Creatinine 8.410.44 - 3.241.00 mg/dL 4.010.51  Sodium 027135 - 253145 mmol/L 134(L)  Potassium 3.5 - 5.1 mmol/L 3.5  Chloride 101 - 111 mmol/L 103  CO2 22 - 32 mmol/L 20(L)  Calcium 8.9 - 10.3 mg/dL 8.9  Total Protein 6.5 - 8.1 g/dL 7.5  Total Bilirubin 0.3 - 1.2 mg/dL 0.4  Alkaline Phos 38 - 126 U/L 185(H)  AST 15 - 41 U/L 21  ALT 14 - 54 U/L 13(L)    Discharge instruction: per After Visit Summary and "Baby and Me Booklet".  After visit meds:    Medication List    TAKE these  medications        prenatal multivitamin Tabs tablet  Take 1 tablet by mouth daily at 12 noon.        Diet: routine diet  Activity: Advance as tolerated. Pelvic rest for 6 weeks.   Outpatient follow up:6 weeks- postpartum. Baby love for BP check Postpartum contraception: IUD Mirena  Newborn Data: Live born female  Birth Weight: 7 lb 15.2 oz (3605 g) APGAR: 8, 9  Baby Feeding: Breast Disposition:home with mother   01/12/2016 Olena LeatherwoodKelly M Aguilar, MD   OB fellow attestation I have seen and examined this patient and agree with above documentation in the resident's note.   Alyssa Patton is a 30 y.o. G3P3001 s/p NSVD.   Pain is well controlled.  Plan for birth control is IUD.  Method of Feeding: breast  PE:  BP 137/85 mmHg  Pulse 67  Temp(Src) 98.8 F (37.1 C) (Oral)  Resp 18  Ht 5\' 3"  (1.6 m)  Wt 156 lb (70.761 kg)  BMI 27.64 kg/m2  SpO2 98%  Breastfeeding? Unknown Gen: well appearing Heart: reg rate Lungs: normal WOB Fundus firm Ext: soft, no pain, no edema  No results for input(s): HGB, HCT in the last 72 hours.  Plan: discharge today - postpartum care discussed - f/u clinic in 6 weeks for postpartum visit   Federico FlakeKimberly Niles Magaby Rumberger, MD 11:21 AM

## 2016-01-12 NOTE — Discharge Instructions (Signed)
Hipertensin posparto (Postpartum Hypertension) La hipertensin posparto es cuando la presin arterial se mantiene ms alta que lo normal durante ms de [employer] del [place]. Puede no darse cuenta de que tiene hipertensin posparto si no le miden la presin arterial con regularidad. En algunos casos, la hipertensin posparto desaparece sola, por lo general en la semana posterior al parto. Sin embargo, algunas mujeres requieren tratamiento mdico para prevenir complicaciones graves, como convulsiones o un ictus. Entre las cosas que pueden influir en la presin arterial, se incluye lo siguiente:  El tipo de [place].  La administracin de lquidos u otros medicamentos por va intravenosa durante o despus del parto. CAUSAS  La hipertensin posparto puede ser consecuencia de cualquiera de los siguientes factores o de la combinacin de cualquiera de ellos:  Hipertensin que exista antes del embarazo (hipertensin crnica).  Hipertensin gestacional.  Preeclampsia o eclampsia.  Administracin de mucho lquido a travs de una va intravenosa durante o despus del parto.  Medicamentos.  Sndrome de HELLP  Hipertiroidismo.  Ictus.  Otros trastornos neurolgicos o sanguneos poco frecuentes. En algunos casos, es posible que la causa no se conozca. FACTORES DE RIESGO La hipertensin posparto puede relacionarse con uno o ms factores de riesgo, por ejemplo:  Hipertensin crnica. En algunos casos, es posible que esta no se haya diagnosticado antes del embarazo.  Obesidad.  Diabetes tipo 2.  Enfermedad renal.  Antecedentes familiares de preeclampsia.  Otras enfermedades que causan desequilibrios hormonales. SIGNOS Y SNTOMAS Al igual que con todos los tipos de hipertensin, la hipertensin posparto puede no causar ningn sntoma. Segn lo alta que est la presin arterial, puede presentar lo siguiente:  Dolores de [place]. Estos pueden ser leves, moderados o intensos. Tambin  pueden ser regulares, constantes o de inicio repentino (cefalea en estallido).  Cambios en la visin.  Mareos.  Falta de aire.  Hinchazn de [employer], los pies, la parte inferior de las piernas o la cara. En algunos casos, puede tener hinchazn en varias de estas reas.  Palpitaciones o latidos cardacos acelerados.  Dificultad para respirar al Tressie [place].  Disminucin de la cantidad [place]. Otros signos y sntomas poco frecuentes pueden incluir lo siguiente:  Ms sudoracin que la habitual. Esta dura ms que [employer] del parto.  Dolor en el pecho.  Mareos repentinos al levantarse despus de haber estado sentada o [place].  Convulsiones.  Nuseas o vmitos.  Dolor abdominal. DIAGNSTICO El diagnstico de hipertensin posparto se establece mediante la combinacin de los [place] de los exmenes fsicos y los anlisis de sangre y [place]. Tambin pueden realizarle [employer], como una tomografa computarizada o una resonancia magntica, para Engineer, manufacturing otras complicaciones de la hipertensin posparto. TRATAMIENTO Cuando la presin arterial est lo suficientemente alta como para requerir tratamiento, las opciones incluyen lo siguiente:  Medicamentos para disminuir la presin arterial (antihipertensivos). Dgale al mdico si est amamantando o si planifica hacerlo. Hay muchos medicamentos antihipertensivos que pueden tomarse sin riesgos durante la Market researcher.  Interrupcin de los [employer] puedan causar la hipertensin.  Tratamiento de las enfermedades que causan la hipertensin.  Tratamiento de las complicaciones de la hipertensin, como convulsiones, ictus o problemas renales. El mdico seguir supervisando atentamente y de forma repetida su presin arterial hasta que esta se encuentre en un nivel seguro para usted.  INSTRUCCIONES PARA EL CUIDADO EN EL HOGAR  Tome los medicamentos solamente como se lo haya indicado el mdico.  Haga actividad  fsica con regularidad despus de que el mdico le diga  que es seguro hacerlo.  Siga las recomendaciones de su mdico sobre los lmites en el consumo de sal y lquido.  No consuma ningn producto que contenga tabaco, lo que incluye cigarrillos, tabaco de Theatre managermascar o Administrator, Civil Servicecigarrillos elecChemical engineertrnicos. Si necesita ayuda para dejar de fumar, consulte al mdico.  Concurra a todas las visitas de control como se lo haya indicado el mdico. Esto es importante. SOLICITE ATENCIN MDICA SI:  Los sntomas empeoran.  Aparecen nuevos sntomas, por ejemplo:  Dolor de cabeza.  Mareos.  Cambios en la visin. SOLICITE ATENCIN MDICA DE INMEDIATO SI:  Tiene un dolor de cabeza intenso o repentino.  Tiene convulsiones.  Siente debilidad o adormecimiento en un lado del cuerpo.  Tiene dificultades para pensar, hablar o tragar.  Sufre un dolor abdominal intenso.  Tiene dificultad para respirar, dolor en el pecho, latidos cardacos acelerados o palpitaciones. Estos sntomas pueden representar un problema grave que constituye Radio broadcast assistantuna emergencia. No espere hasta que los sntomas desaparezcan. Solicite atencin mdica de inmediato. Comunquese con el servicio de emergencias de su localidad (911 en los [place]). No conduzca por sus propios medios OfficeMax Incorporatedhasta el hospital.   Esta informacin no tiene Theme park managercomo fin reemplazar el consejo del mdico. Asegrese de hacerle al mdico cualquier pregunta que tenga.   Document Released: 04/17/2014 Elsevier Interactive Patient Education Yahoo! Inc2016 Elsevier Inc.  Preeclampsia y eclampsia (Preeclampsia and Eclampsia) La preeclampsia es una afeccin grave que solo se manifiesta durante el Deshlerembarazo. Tambin se la conoce como toxemia del Psychiatristembarazo. Esta afeccin provoca el aumento de la presin arterial junto con otros sntomas, como hinchazn y dolores de Turk[place]istancabeza, que pueden aparecer a medida que la preeclampsia empeora. La preeclampsia puede presentarse a las 20semanas de gestacin o  posteriormente.  El diagnstico y el tratamiento tempranos de esta afeccin son muy importantes. Si no se la trata de inmediato, puede causarles problemas graves a usted y al beb. Una complicacin que la preeclampsia puede provocar es la eclampsia, una afeccin que causa temblores o contracciones musculares (convulsiones) en la Richboromadre. Provocar el parto del beb es el mejor tratamiento para la preeclampsia o la eclampsia.  FACTORES DE RIESGO La causa de la preeclampsia no se conoce. Puede tener ms probabilidades de desarrollar la afeccin si tiene ciertos factores de Chief of Staffriesgo. Estos incluyen:   Estar embarazada por primera vez.  Haber tenido preeclampsia en un embarazo anterior.  Tener antecedentes familiares de preeclampsia.  Tener presin arterial alta.  Estar embarazada de gemelos o trillizos.  Tener 35aos o ms.  Pertenecer a Engineer, productionla raza afroamericana.  Tener enfermedad renal o diabetes.  Tener enfermedades, como lupus o enfermedades de la [place]re.  Tener mucho sobrepeso (obesidad). SIGNOS Y SNTOMAS  Los primeros signos de preeclampsia son:  Hipertensin arterial.  Aumento de las protenas en la orina. El Facilities managermdico la controlar en cada visita prenatal. Otros sntomas que pueden presentarse incluyen:   Dolor de cabeza intenso  Aumento repentino de peso.  Hinchazn de las manos, el rostro, las piernas y RadioShacklos pies.  Malestar estomacal (nuseas) y (vmitos).  Problemas visuales (visin doble o borrosa).  Adormecimiento del rostro, los Almyrabrazos, las piernas y los pies.  Mareos.  Hablar arrastrando las palabras.  Sensibilidad a las luces brillantes.  Dolor abdominal. DIAGNSTICO  No hay pruebas diagnsticas para la preeclampsia. El mdico le har preguntas sobre los sntomas y verificar la presencia de signos de preeclampsia durante las visitas prenatales. Tambin pueden hacerle exmenes que incluyen:  Anlisis de [place].  Anlisis de [place]ang[place].  Control de la  frecuencia  cardaca del beb.  Control de la salud del beb y de la placenta mediante el uso de imgenes que se generan con ondas sonoras (ecografa). TRATAMIENTO  Puede planificar el mejor abordaje de tratamiento junto con el mdico. Es muy importante que concurra a todas las visitas prenatales. Si tiene un riesgo ms alto de tener preeclampsia, tal vez necesite exmenes prenatales con ms frecuencia.  El mdico tambin puede indicarle que haga reposo en cama.  Tal vez deba comer la menor cantidad de sal posible.  Es posible que tenga que tomar medicamentos para bajar la presin arterial si la afeccin no responde a medidas ms conservadoras.  Quizs deba [facility] si la afeccin es grave. All, el tratamiento estar centrado en el control de la presin arterial y la retencin de lquidos. Puede que tambin tenga que tomar medicamentos para evitar las convulsiones.  Si la afeccin empeora, tal vez sea necesario adelantar el parto para protegerlos a usted y al beb. Pueden provocarle el trabajo de parto con medicamentos (inducirse) o hacerle una cesrea.  Generalmente, la preeclampsia desaparece despus del parto. INSTRUCCIONES PARA EL CUIDADO EN EL HOGAR   Tome los medicamentos de venta libre o recetados solamente segn las indicaciones del mdico.  Acustese sobre el lado izquierdo mientras hace reposo; de [place], se quita la presin del beb.  Levante los pies mientras hace reposo.  Realice actividad fsica con regularidad. Consulte al mdico qu tipo de ejercicio es seguro para usted.  Evite la cafena y el alcohol.  No fume.  Beba de 6 a 8vasos de Warehouse manager.  Coma una dieta equilibrada con bajo contenido de sal. No agregue sal a las comidas.  Evite las situaciones estresantes tanto como sea posible.  Descanse y duerma lo suficiente.  Concurra a todas las visitas prenatales y hgase todos los anlisis segn lo programado. SOLICITE ATENCIN MDICA SI:  Aumenta  de peso ms de lo esperado.  Tiene dolores de cabeza, dolor abdominal o nuseas.  Aparecen ms hematomas que lo normal.  Se siente mareada o tiene vahdos. SOLICITE ATENCIN MDICA DE INMEDIATO SI:   Aparece una hinchazn repentina o tiene una zona muy hinchada en cualquier parte del cuerpo. Esto suele ocurrir en las piernas.  Tiene un aumento de peso de 5libras (2,3kg) o ms en una semana.  Tiene dolor de cabeza intenso, mareos, problemas visuales o confusin.  Siente un dolor abdominal intenso.  Tiene nuseas o vmitos permanentes.  Tiene convulsiones.  Tiene dificultad para mover cualquier parte del cuerpo.  Tiene adormecimiento en el cuerpo.  Tiene dificultad para hablar.  Tiene cualquier sangrado anormal.  Siente rigidez en el cuello.  Se desmaya. ASEGRESE DE QUE:   Comprende estas instrucciones.  Controlar su afeccin.  Recibir ayuda de inmediato si no mejora o si empeora.   Esta informacin no tiene Theme park manager el consejo del mdico. Asegrese de hacerle al mdico cualquier pregunta que tenga.   Document Released: 04/12/2005 Document Revised: 07/08/2013 Elsevier Interactive Patient Education 2016 [employer]. Cuidados en el postparto luego de un parto vaginal  (Postpartum Care After Vaginal Delivery) Despus del parto (perodo de postparto), la estada normal en el hospital es de 24-72 horas. Si hubo problemas con el trabajo de parto o el parto, o si tiene otros problemas mdicos, es posible que Hydrologist en el hospital por ms [place].  Mientras est en el hospital, recibir [place] e instrucciones sobre cmo cuidar de usted misma y de [place]  beb recin nacido durante el postparto.  Mientras est en el hospital:   Asegrese de decirle a las enfermeras si siente dolor o Dentist, as como donde Medical laboratory scientific officer y Training and development officer.  Si usted tuvo una incisin cerca de la vagina (episiotoma) o si ha tenido Armed forces technical officer,  las enfermeras le pondrn hielo sobre la episiotoma o Art therapist. Las bolsas de hielo pueden ayudar a Glass blower/designer y la hinchazn.  Si est amamantando, puede sentir contracciones dolorosas en el tero durante algunas semanas. Esto es normal. Las contracciones ayudan a que el tero vuelva a su tamao normal.  Es normal tener algo de sangrado despus del [place].  Durante los primeros 1-3 das despus del parto, el flujo es de color rojo y la cantidad puede ser similar a un perodo.  Es frecuente que el flujo se inicie y se Chief Strategy Officer.  En los primeros [place], puede eliminar algunos cogulos pequeos. Informe a las enfermeras si elimina cogulos grandes o aumenta el flujo.  No  elimine los cogulos de sangre por el inodoro antes de que la [employer] vea.  Durante los prximos 3 a [address] despus del parto, el flujo debe ser ms acuoso y rosado o Child psychotherapist.  [place] a catorce [employer] del parto, el flujo debe ser una pequea cantidad de secrecin de color blanco amarillento.  La cantidad de flujo disminuir en las primeras semanas despus del parto. El flujo puede detenerse en 6-8 semanas. La mayora de las mujeres no tienen ms flujo a las 12 semanas despus del [place].  Usted debe cambiar sus apsitos con frecuencia.  Lvese bien las manos con agua y jabn durante al menos 20 segundos despus de cambiar el apsito, usar el bao o antes de Occupational psychologist o Corporate treasurer a su recin nacido.  Usted podr sentir como que tiene que vaciar la vejiga durante las primeras 6-8 horas despus del [place].  En caso de que sienta debilidad, mareo o [place], llame a la enfermera antes de levantarse de la cama por primera vez y antes de tomar una ducha por primera vez.  Dentro de los [employer] del parto, sus mamas pueden comenzar a estar sensibles y [place]. Esto se llama congestin. La sensibilidad en los senos por lo general desaparece dentro de las 48-72 horas despus de que ocurre la congestin.  Tambin puede notar que la [place] se escapa de sus senos. Si no est amamantando no estimule sus pechos. La estimulacin de las mamas hace que sus senos produzcan ms [place].  Pasar tanto tiempo como le sea posible con el beb recin nacido es muy importante. Durante ese tiempo, usted y su beb deben sentirse cerca y conocerse uno al otro. Tener al beb en su habitacin (alojamiento conjunto) ayudar a fortalecer el vnculo con el beb recin nacido.Esto le dar tiempo para conocerlo y atenderlo de [place] cmoda.  Las hormonas se modifican despus del parto. A veces, los cambios hormonales pueden causar tristeza o ganas de llorar por un tiempo. Estos sentimientos no deben durar ms de [employer]. Si duran ms que eso, debe hablar con su mdico.  Si lo desea, hable con su mdico acerca de los mtodos de planificacin familiar o mtodos anticonceptivos.  Hable con su mdico acerca de las vacunas. El mdico puede indicarle que se aplique las siguientes vacunas antes de salir del hospital:  [place] contra el ttanos, la difteria y la tos ferina (Tdap) o el ttanos y la  difteria (Td). Es muy importante que usted y su familia (incluyendo a los abuelos) u otras personas que cuidan al recin nacido estn al da con las vacunas Tdap o Td. Las vacunas Tdap o Td pueden ayudar a proteger al recin nacido de enfermedades.  Inmunizacin contra la rubola.  Inmunizacin contra la varicela.  Inmunizacin contra la gripe. Usted debe recibir esta vacunacin anual si no la ha recibido Academic librariandurante el embarazo.   Esta informacin no tiene Theme park managercomo fin reemplazar el consejo del mdico. Asegrese de hacerle al mdico cualquier pregunta que tenga.   Document Released: 04/30/2007 Document Revised: 03/27/2012 Elsevier Interactive Patient Education Yahoo! Inc2016 Elsevier Inc.

## 2016-01-12 NOTE — Progress Notes (Signed)
UR chart review completed.  

## 2016-01-12 NOTE — Lactation Note (Signed)
This note was copied from a baby's chart. Lactation Consultation Note  Patient Name: Girl Wallace GoingDora Cruz-Ruiz WGNFA'OToday's Date: 01/12/2016   Visited with Mom and FOB, baby 7822 hrs old.  Mom holding baby dressed and with a blanket.  FOB speaks [place].  Explained importance of keeping baby skin to skin.  Explained that baby would be more sleepy on the breast if she keeps her dressed and wrapped.  Baby has had 5 feedings since birth.  Offered assistance with latch, but Mom declined saying she is ok.  Reviewed what a good latch would be, and importance of feeling a tug, no pinch.  Engorgement prevention and treatment discussed. Talked about importance of trying to feed baby 8-12 times in 24 hrs.  Mom states she has a double electric breast pump at home. FOB said they are trying to get discharged as they have 2 children at home.   Reminded Mom and FOB about OP lactation assistance that is available.  Encouraged to call prn.   Judee ClaraSmith, Volanda Mangine E 01/12/2016, 3:11 PM

## 2016-01-12 NOTE — Progress Notes (Signed)
POSTPARTUM PROGRESS NOTE  Post Partum Day 1 Subjective:  Wallace GoingDora Cruz-Ruiz is a 30 y.o. [id number] 6545w1d s/p NSVD after IOL for pre-eclampsia without severe features. Pt denies any symptoms of HA, changes in vision, CP, SOB, RUQ pain, swelling in extremities.  Pt denies problems with ambulating, voiding or po intake.  She denies nausea or vomiting.  Pain is well controlled.  She has had flatus. She has not had bowel movement.  Lochia Minimal.   Objective: Blood pressure 111/66, pulse 83, temperature 98.1 F (36.7 C), temperature source Oral, resp. rate 18, height 5\' 3"  (1.6 m), weight 156 lb (70.761 kg), SpO2 98 %, unknown if currently breastfeeding.  Physical Exam:  General: alert, cooperative and no distress Lochia:normal flow Chest: CTAB Heart: RRR no m/r/g Abdomen: +BS, soft, nontender,  Uterine Fundus: firm, 2 below umbilicus DVT Evaluation: No calf swelling or tenderness Extremities: No edema   Recent Labs  01/10/16 1213 01/11/16 0907  HGB 13.2 12.2  HCT 37.4 35.8*    Assessment/Plan:  ASSESSMENT: Wallace GoingDora Cruz-Ruiz is a 30 y.o. [id number] 4945w1d s/p SVD after IOL for pre-eclampsia without severe features  #Pre-Eclampsia -BP have remained wnl after delivery -Adequate UOP -Remains asymptomatic  #Postpartum -Breastfeeding without difficulty -BC with IUD -Expect d/c in AM tomorrow -Infant remains in house for 48 hour rule out since MOB is GBS positive- received adequate PCN dosing prior to delivery   LOS: 2 days   Olena LeatherwoodKelly M Aguilar 01/12/2016, 7:38 AM   OB fellow attestation:  I have seen and examined this patient; I agree with above documentation in the resident's note.   Federico FlakeKimberly Niles Autie Vasudevan, MD 2:04 PM

## 2016-01-12 NOTE — Progress Notes (Signed)
I assisted Donna,RN with questions.  Eda H Royal  Interpreter.

## 2016-01-17 ENCOUNTER — Inpatient Hospital Stay (HOSPITAL_COMMUNITY): Payer: Self-pay

## 2017-04-22 ENCOUNTER — Emergency Department (HOSPITAL_COMMUNITY): Payer: No Typology Code available for payment source

## 2017-04-22 ENCOUNTER — Emergency Department (HOSPITAL_COMMUNITY)
Admission: EM | Admit: 2017-04-22 | Discharge: 2017-04-22 | Disposition: A | Discharge status: Home / Self Care | Payer: No Typology Code available for payment source | Attending: Emergency Medicine | Admitting: Emergency Medicine

## 2017-04-22 ENCOUNTER — Encounter (HOSPITAL_COMMUNITY): Payer: Self-pay | Admitting: *Deleted

## 2017-04-22 DIAGNOSIS — Y999 Unspecified external cause status: Secondary | ICD-10-CM | POA: Diagnosis not present

## 2017-04-22 DIAGNOSIS — I10 Essential (primary) hypertension: Secondary | ICD-10-CM | POA: Insufficient documentation

## 2017-04-22 DIAGNOSIS — Y939 Activity, unspecified: Secondary | ICD-10-CM | POA: Diagnosis not present

## 2017-04-22 DIAGNOSIS — M25522 Pain in left elbow: Secondary | ICD-10-CM | POA: Insufficient documentation

## 2017-04-22 DIAGNOSIS — Y929 Unspecified place or not applicable: Secondary | ICD-10-CM | POA: Insufficient documentation

## 2017-04-22 MED ORDER — NAPROXEN 375 MG PO TABS: 375.0000 mg | ORAL_TABLET | Freq: Two times a day (BID) | ORAL | 0 refills | Status: AC

## 2017-04-22 MED ORDER — ACETAMINOPHEN 325 MG PO TABS
650.0000 mg | ORAL_TABLET | Freq: Once | ORAL | Status: AC
  Administered 2017-04-22: 650 mg via ORAL
  Filled 2017-04-22: qty 2

## 2017-04-22 NOTE — ED Notes (Signed)
Declined W/C at D/C and was escorted to lobby by RN. 
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
## 2019-11-13 ENCOUNTER — Emergency Department (HOSPITAL_COMMUNITY)
Admission: EM | Admit: 2019-11-13 | Discharge: 2019-11-14 | Disposition: A | Discharge status: Home / Self Care | Payer: Worker's Compensation | Attending: Emergency Medicine | Admitting: Emergency Medicine

## 2019-11-13 ENCOUNTER — Other Ambulatory Visit: Payer: Self-pay

## 2019-11-13 ENCOUNTER — Emergency Department (HOSPITAL_COMMUNITY): Payer: Worker's Compensation

## 2019-11-13 ENCOUNTER — Encounter (HOSPITAL_COMMUNITY): Payer: Self-pay | Admitting: Emergency Medicine

## 2019-11-13 DIAGNOSIS — Y990 Civilian activity done for income or pay: Secondary | ICD-10-CM | POA: Insufficient documentation

## 2019-11-13 DIAGNOSIS — S61211A Laceration without foreign body of left index finger without damage to nail, initial encounter: Secondary | ICD-10-CM | POA: Insufficient documentation

## 2019-11-13 DIAGNOSIS — Y93G1 Activity, food preparation and clean up: Secondary | ICD-10-CM | POA: Insufficient documentation

## 2019-11-13 DIAGNOSIS — Y9259 Other trade areas as the place of occurrence of the external cause: Secondary | ICD-10-CM | POA: Diagnosis not present

## 2019-11-13 DIAGNOSIS — Z79899 Other long term (current) drug therapy: Secondary | ICD-10-CM | POA: Diagnosis not present

## 2019-11-13 DIAGNOSIS — Z23 Encounter for immunization: Secondary | ICD-10-CM | POA: Diagnosis not present

## 2019-11-13 DIAGNOSIS — W260XXA Contact with knife, initial encounter: Secondary | ICD-10-CM | POA: Insufficient documentation

## 2019-11-13 DIAGNOSIS — S6992XA Unspecified injury of left wrist, hand and finger(s), initial encounter: Secondary | ICD-10-CM | POA: Diagnosis present

## 2019-11-13 NOTE — ED Triage Notes (Signed)
Patient presents with skin laceration at left distal index finger with minimal bleeding sustained this afternoon from a kitchen knife , Dressing applied at triage .

## 2019-11-14 MED ORDER — LIDOCAINE-EPINEPHRINE (PF) 2 %-1:200000 IJ SOLN
20.0000 mL | Freq: Once | INTRAMUSCULAR | Status: AC
  Administered 2019-11-14: 20 mL
  Filled 2019-11-14: qty 20

## 2019-11-14 MED ORDER — CEPHALEXIN 500 MG PO CAPS: 500.0000 mg | ORAL_CAPSULE | Freq: Two times a day (BID) | ORAL | 0 refills | Status: AC

## 2019-11-14 MED ORDER — TETANUS-DIPHTH-ACELL PERTUSSIS 5-2.5-18.5 LF-MCG/0.5 IM SUSP
0.5000 mL | Freq: Once | INTRAMUSCULAR | Status: AC
  Administered 2019-11-14: 03:00:00 0.5 mL via INTRAMUSCULAR
  Filled 2019-11-14: qty 0.5

## 2019-11-14 MED ORDER — BACITRACIN ZINC 500 UNIT/GM EX OINT
1.0000 "application " | TOPICAL_OINTMENT | Freq: Two times a day (BID) | CUTANEOUS | Status: DC
  Administered 2019-11-14: 1 via TOPICAL
  Filled 2019-11-14: qty 0.9

## 2019-11-14 NOTE — Discharge Instructions (Signed)
The sutures should dissolve and 5 to 7 days.  Keep the wound clean and dry until it is healed.

## 2019-11-14 NOTE — ED Provider Notes (Signed)
[facility] EMERGENCY DEPARTMENT Provider Note   CSN: [id number] Arrival date & time: 11/13/19  2227     History Chief Complaint  Patient presents with  . Laceration    Finger    Alyssa Patton is a 34 y.o. female.  Patient presents to the emergency department with a chief complaint of left index finger laceration.  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  There were dishes in the sink, and she was cleaning them she cut her finger on a knife.  She complains of mild pain.  Denies any other injuries.  Denies any numbness or weakness.  Bleeding controlled with gauze prior to arrival.  The history is provided by the patient. The history is limited by a language barrier. A language interpreter was used.       Past Medical History:  Diagnosis Date  . Pregnancy induced hypertension     Patient Active Problem List   Diagnosis Date Noted  . NSVD (normal spontaneous vaginal delivery) 01/14/2016  . Mild preeclampsia 01/10/2016    Past Surgical History:  Procedure Laterality Date  . NO PAST SURGERIES       OB History    Gravida  3   Para  3   Term  3   Preterm      AB      Living  1     SAB      TAB      Ectopic      Multiple  0   Live Births  1           No family history on file.  Social History   Tobacco Use  . Smoking status: Never Smoker  Substance Use Topics  . Alcohol use: No  . Drug use: No    Home Medications Prior to Admission medications   Medication Sig Start Date End Date Taking? Authorizing Provider  naproxen (NAPROSYN) 375 MG tablet Take 1 tablet (375 mg total) by mouth 2 (two) times daily with a meal. 04/22/17   Everlene Farrier, PA-C  Prenatal Vit-Fe Fumarate-FA (PRENATAL MULTIVITAMIN) TABS tablet Take 1 tablet by mouth daily at 12 noon.    [provider]    Allergies    Patient has no known allergies.  Review of Systems   Review of Systems  Constitutional: Negative for  chills and fever.  Skin: Positive for wound. Negative for color change and rash.  Neurological: Negative for weakness and numbness.    Physical Exam Updated Vital Signs BP (!) 125/94 (BP Location: Right Arm)   Pulse 74   Temp 99 F (37.2 C) (Oral)   Resp 14   Ht 5\' 5"  (1.651 m)   Wt 70 kg   LMP 09/30/2019   SpO2 100%   BMI 25.68 kg/m   Physical Exam Vitals and nursing note reviewed.  Constitutional:      General: She is not in acute distress.    Appearance: She is well-developed.  HENT:     Head: Normocephalic and atraumatic.  Eyes:     Conjunctiva/sclera: Conjunctivae normal.  Cardiovascular:     Rate and Rhythm: Normal rate.     Heart sounds: No murmur.  Pulmonary:     Effort: Pulmonary effort is normal. No respiratory distress.  Abdominal:     General: There is no distension.  Musculoskeletal:     Cervical back: Neck supple.     Comments: Moves all extremities  Skin:  General: Skin is warm and dry.     Comments: 1 cm laceration to the palmar aspect of the left index finger at the distal phalanx, no underlying tendon injury  Neurological:     Mental Status: She is alert and oriented to person, place, and time.  Psychiatric:        Mood and Affect: Mood normal.        Behavior: Behavior normal.     ED Results / Procedures / Treatments   Labs (all labs ordered are listed, but only abnormal results are displayed) Labs Reviewed - No data to display  EKG None  Radiology DG Finger Index Left  Result Date: 11/13/2019 CLINICAL DATA:  Second digit laceration, initial encounter EXAM: LEFT INDEX FINGER 2+V COMPARISON:  None. FINDINGS: Soft tissue defect is noted distally in the second digit consistent with the given clinical history. No radiopaque foreign body is noted. No bony abnormality is seen. IMPRESSION: Soft tissue defect as described without acute bony abnormality. Electronically Signed   By: Alcide Clever M.D.   On: 11/13/2019 23:14     Procedures .Marland KitchenLaceration Repair  Date/Time: 11/14/2019 4:01 AM Performed by: Roxy Horseman, PA-C Authorized by: Roxy Horseman, PA-C   Consent:    Consent obtained:  Verbal   Consent given by:  Patient   Risks discussed:  Infection, need for additional repair, pain, poor cosmetic result and poor wound healing   Alternatives discussed:  No treatment and delayed treatment Universal protocol:    Procedure explained and questions answered to patient or proxy's satisfaction: yes     Relevant documents present and verified: yes     Test results available and properly labeled: yes     Imaging studies available: yes     Required blood products, implants, devices, and special equipment available: yes     Site/side marked: yes     Immediately prior to procedure, a time out was called: yes     Patient identity confirmed:  Verbally with patient Anesthesia (see MAR for exact dosages):    Anesthesia method:  Local infiltration   Local anesthetic:  Lidocaine 1% WITH epi Laceration details:    Location:  Finger   Finger location:  L index finger   Length (cm):  1 Repair type:    Repair type:  Simple Pre-procedure details:    Preparation:  Patient was prepped and draped in usual sterile fashion Exploration:    Hemostasis achieved with:  Direct pressure   Wound exploration: wound explored through full range of motion and entire depth of wound probed and visualized     Wound extent: no tendon damage noted     Contaminated: no   Treatment:    Area cleansed with:  Soap and water   Amount of cleaning:  Extensive   Irrigation solution:  Tap water   Visualized foreign bodies/material removed: no   Skin repair:    Repair method:  Sutures   Suture size:  4-0   Wound skin closure material used: Vicryl Rapide.   Suture technique:  Simple interrupted   Number of sutures:  5 Approximation:    Approximation:  Close Post-procedure details:    Dressing:  Antibiotic ointment   Patient  tolerance of procedure:  Tolerated well, no immediate complications   (including critical care time)  Medications Ordered in ED Medications  bacitracin ointment 1 application (has no administration in time range)  lidocaine-EPINEPHrine (XYLOCAINE W/EPI) 2 %-1:200000 (PF) injection 20 mL (20 mLs Infiltration Given 11/14/19 0314)  Tdap (BOOSTRIX) injection 0.5 mL (0.5 mLs Intramuscular Given 11/14/19 0314)    ED Course  I have reviewed the triage vital signs and the nursing notes.  Pertinent labs & imaging results that were available during my care of the patient were reviewed by me and considered in my medical decision making (see chart for details).    MDM Rules/Calculators/A&P                      Patient with small laceration to left index finger on the palmar aspect.  There is no underlying tendon injury.  Plain films are negative.  The wound is gaping quite significantly, and will likely need primary closure despite being open since yesterday evening.  The wound was copiously irrigated.  I will start the patient on antibiotics given that the wound was closed within 6 hours.  Return precautions discussed. Final Clinical Impression(s) / ED Diagnoses Final diagnoses:  Laceration of left index finger without foreign body without damage to nail, initial encounter    Rx / DC Orders ED Discharge Orders         Ordered    cephALEXin (KEFLEX) 500 MG capsule  2 times daily     11/14/19 0403           Montine Circle, PA-C 11/14/19 0404    Palumbo, April, MD 11/14/19 [phone]

## 2020-07-14 IMAGING — CR DG FINGER INDEX 2+V*L*
3 series · 3 of 3 positions shown · non-contrast
Comparison: None.

CLINICAL DATA: Second digit laceration, initial encounter

EXAM:
LEFT INDEX FINGER 2+V

[finger ap]
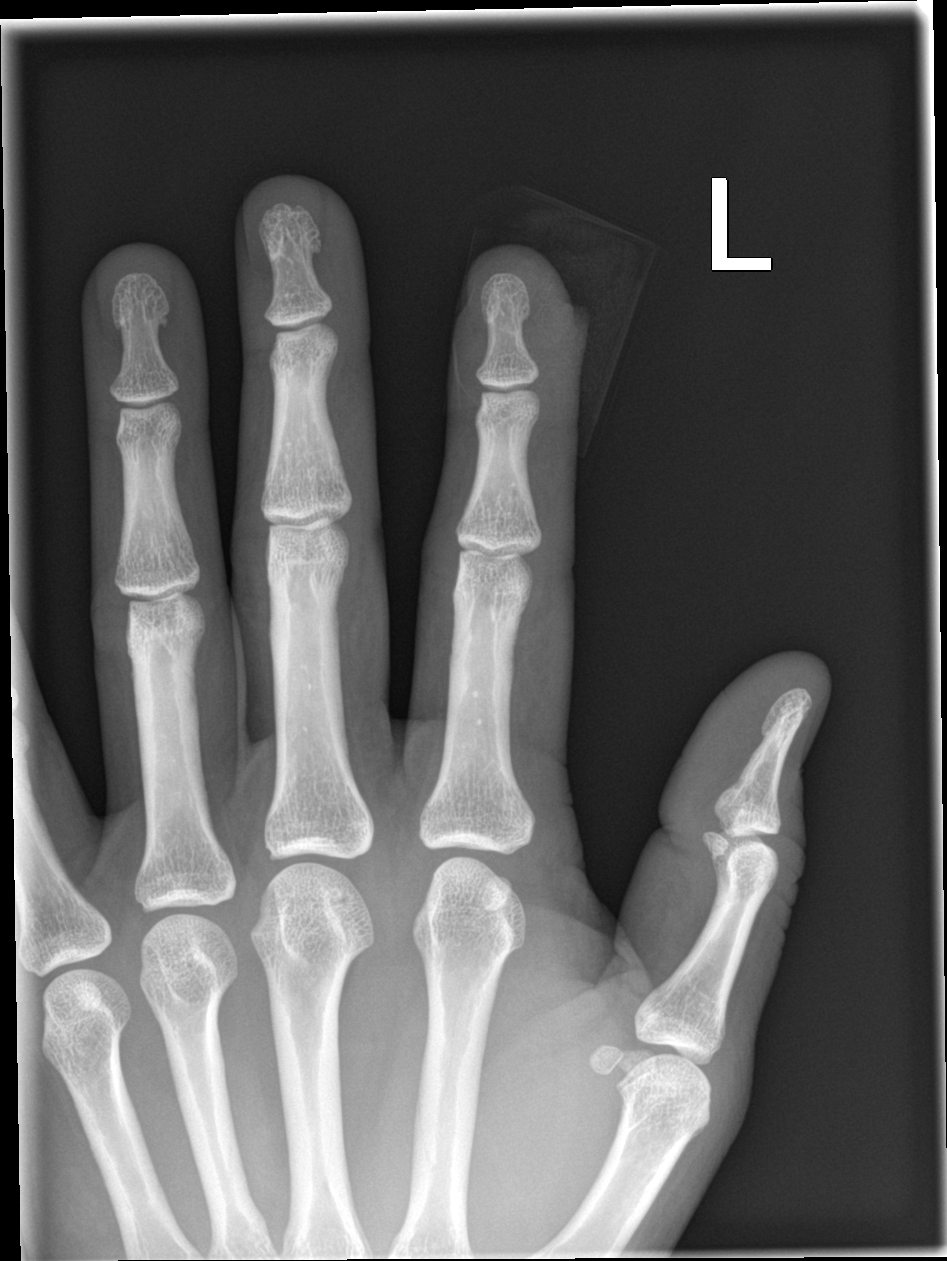

[finger obl]
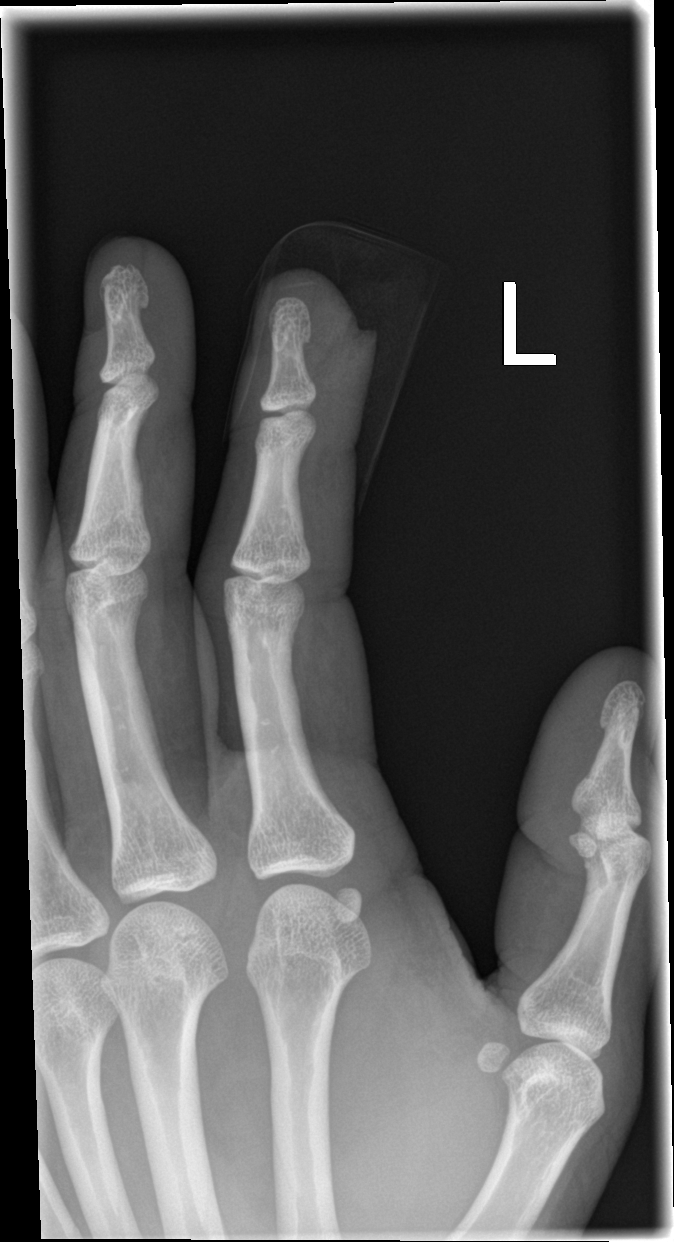

[finger lat]
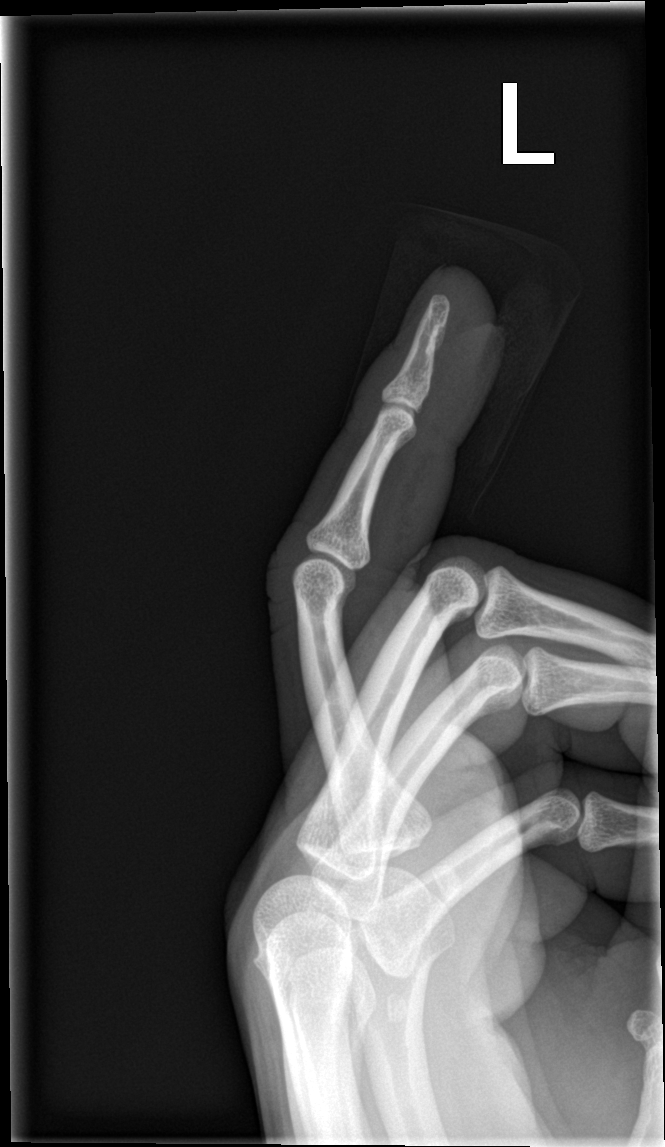

[3 of 3 positions shown; findings below may reference images not displayed]

FINDINGS: Soft tissue defect is noted distally in the second digit consistent
with the given clinical history. No radiopaque foreign body is
noted. No bony abnormality is seen.
IMPRESSION: Soft tissue defect as described without acute bony abnormality.
